# Patient Record
Sex: Male | Born: 1995 | Race: Black or African American | Hispanic: No | Marital: Single | Smoking: Never smoker
Health system: Southern US, Community
[De-identification: ages and names within clinical notes are randomized; demographics above are authoritative.]

## PROBLEM LIST (undated history)

## (undated) DIAGNOSIS — S43492A Other sprain of left shoulder joint, initial encounter: Secondary | ICD-10-CM

## (undated) DIAGNOSIS — F909 Attention-deficit hyperactivity disorder, unspecified type: Secondary | ICD-10-CM

## (undated) DIAGNOSIS — S43432A Superior glenoid labrum lesion of left shoulder, initial encounter: Secondary | ICD-10-CM

## (undated) DIAGNOSIS — M25319 Other instability, unspecified shoulder: Secondary | ICD-10-CM

## (undated) DIAGNOSIS — J45909 Unspecified asthma, uncomplicated: Secondary | ICD-10-CM

## (undated) DIAGNOSIS — T7840XA Allergy, unspecified, initial encounter: Secondary | ICD-10-CM

## (undated) HISTORY — DX: Other instability, unspecified shoulder: M25.319

## (undated) HISTORY — DX: Other sprain of left shoulder joint, initial encounter: S43.492A

## (undated) HISTORY — DX: Superior glenoid labrum lesion of left shoulder, initial encounter: S43.432A

---
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
## 2017-11-30 ENCOUNTER — Emergency Department (HOSPITAL_COMMUNITY): Admission: EM | Admit: 2017-11-30 | Discharge: 2017-11-30 | Discharge status: Against Medical Advice | Payer: Self-pay

## 2019-03-31 ENCOUNTER — Emergency Department (HOSPITAL_COMMUNITY)
Admission: EM | Admit: 2019-03-31 | Discharge: 2019-03-31 | Disposition: A | Discharge status: Home / Self Care | Payer: HRSA Program | Attending: Emergency Medicine | Admitting: Emergency Medicine

## 2019-03-31 ENCOUNTER — Other Ambulatory Visit: Payer: Self-pay

## 2019-03-31 ENCOUNTER — Emergency Department (HOSPITAL_COMMUNITY): Payer: HRSA Program

## 2019-03-31 DIAGNOSIS — Z20822 Contact with and (suspected) exposure to covid-19: Secondary | ICD-10-CM

## 2019-03-31 DIAGNOSIS — Z79899 Other long term (current) drug therapy: Secondary | ICD-10-CM | POA: Diagnosis not present

## 2019-03-31 DIAGNOSIS — U071 COVID-19: Secondary | ICD-10-CM | POA: Diagnosis not present

## 2019-03-31 DIAGNOSIS — J45909 Unspecified asthma, uncomplicated: Secondary | ICD-10-CM | POA: Insufficient documentation

## 2019-03-31 DIAGNOSIS — R0602 Shortness of breath: Secondary | ICD-10-CM | POA: Diagnosis present

## 2019-03-31 NOTE — ED Triage Notes (Signed)
Pt states he has lost sense of taste and smell, feeling intermittent SOB, and fever x 3 days. Pt states he does not feel SOB at this moment.

## 2019-03-31 NOTE — ED Provider Notes (Signed)
[facility] DEPT Provider Note   CSN: [id number] Arrival date & time: 03/31/19  1121    History   Chief Complaint Chief Complaint  Patient presents with  . Shortness of Breath  . Fever    HPI Craig Hubbard is a 23 y.o. male.     Patient with 2-day history of fever and shortness of breath and loss of taste and smell.  Patient is concerned about having COVID-19 infection.  Patient's past medical history noncontributory.  Patient denies any nausea vomiting or diarrhea.  No history of asthma.     Past Medical History:  Diagnosis Date  . ADHD (attention deficit hyperactivity disorder)   . Allergy   . Asthma   . Bankart lesion of left shoulder   . Shoulder joint instability     Patient Active Problem List   Diagnosis Date Noted  . Suicidal ideations   . MDD (major depressive disorder), recurrent episode, severe ([place]) 12/21/2016  . Shoulder joint instability   . Bankart lesion of left shoulder   . ADHD (attention deficit hyperactivity disorder)   . Allergy   . Asthma     Past Surgical History:  Procedure Laterality Date  . SHOULDER ARTHROSCOPY WITH BANKART REPAIR Left 09/03/2013   Procedure: LEFT SHOULDER ARTHROSCOPY WITH CAPSULORRHAPHY;  Surgeon: Lorn Junes, MD;  Location: [facility];  Service: Orthopedics;  Laterality: Left;        Home Medications    Prior to Admission medications   Medication Sig Start Date End Date Taking? Authorizing Provider  FLUoxetine (PROZAC) 20 MG capsule Take 1 capsule (20 mg total) by mouth daily. 12/25/16  Yes Derrill Center, NP  hydrOXYzine (ATARAX/VISTARIL) 25 MG tablet Take 1 tablet (25 mg total) by mouth every 6 (six) hours as needed for anxiety. 12/24/16  Yes Derrill Center, NP  traZODone (DESYREL) 50 MG tablet Take 1 tablet (50 mg total) by mouth at bedtime as needed for sleep. Patient not taking: Reported on 03/31/2019 12/24/16 01/23/17  Derrill Center, NP    Family History  Family History  Problem Relation Age of Onset  . Hypertension Mother   . Diabetes Mother     Social History Social History   Tobacco Use  . Smoking status: Never Smoker  . Smokeless tobacco: Never Used  Substance Use Topics  . Alcohol use: No  . Drug use: No     Allergies   Patient has no known allergies.   Review of Systems Review of Systems  Constitutional: Positive for fever. Negative for chills.  HENT: Negative for rhinorrhea and sore throat.   Eyes: Negative for visual disturbance.  Respiratory: Positive for shortness of breath. Negative for cough.   Cardiovascular: Negative for chest pain and leg swelling.  Gastrointestinal: Negative for abdominal pain, diarrhea, nausea and vomiting.  Genitourinary: Negative for dysuria.  Musculoskeletal: Negative for back pain and neck pain.  Skin: Negative for rash.  Neurological: Negative for dizziness, light-headedness and headaches.  Hematological: Does not bruise/bleed easily.  Psychiatric/Behavioral: Negative for confusion.     Physical Exam Updated Vital Signs BP (!) 129/92   Pulse 89   Temp 98.3 F (36.8 C) (Oral)   Resp 18   SpO2 95%   Physical Exam Vitals signs and nursing note reviewed.  Constitutional:      Appearance: Normal appearance. He is well-developed.  HENT:     Head: Normocephalic and atraumatic.  Eyes:     Conjunctiva/sclera: Conjunctivae normal.  Pupils: Pupils are equal, round, and reactive to light.  Neck:     Musculoskeletal: Normal range of motion and neck supple.  Cardiovascular:     Rate and Rhythm: Normal rate and regular rhythm.     Heart sounds: No murmur.  Pulmonary:     Effort: Pulmonary effort is normal. No respiratory distress.     Breath sounds: Normal breath sounds.  Abdominal:     Palpations: Abdomen is soft.     Tenderness: There is no abdominal tenderness.  Musculoskeletal: Normal range of motion.  Skin:    General: Skin is warm and dry.  Neurological:      General: No focal deficit present.     Mental Status: He is alert and oriented to person, place, and time.      ED Treatments / Results  Labs (all labs ordered are listed, but only abnormal results are displayed) Labs Reviewed - No data to display  EKG None  [facility]  Result Date: 03/31/2019 CLINICAL DATA:  Shortness of breath.  Fever.  Anosmia EXAM: PORTABLE CHEST 1 VIEW COMPARISON:  None. FINDINGS: Lungs are clear. Heart is slightly enlarged with pulmonary vascularity normal. No adenopathy. No bone lesions. IMPRESSION: Mild cardiac enlargement.  No edema or consolidation. Electronically Signed   By: Bretta BangWilliam  Woodruff III M.D.   On: 03/31/2019 13:16    Procedures Procedures (including critical care time)  Medications Ordered in ED Medications - No data to display   Initial Impression / Assessment and Plan / ED Course  I have reviewed the triage vital signs and the nursing notes.  Pertinent labs & imaging results that were available during my care of the patient were reviewed by me and considered in my medical decision making (see chart for details).        Patient nontoxic no acute distress.  Oxygen saturations are fine in the upper 90s.  Patient does not have increased respiratory rate.  Chest x-ray formal results are pending but x-ray looked normal on first reviewed by me.  Patient will require work note.  Patient will have outpatient COVID testing if chest x-ray does not warrant admission.  Patient chest x-ray without any acute findings.  Outpatient test for COVID and give patient work note for the next 5 days.  Patient will return for any new or worse symptoms.  Final Clinical Impressions(s) / ED Diagnoses   Final diagnoses:  Suspected Covid-19 Virus Infection    ED Discharge Orders    None       Vanetta MuldersZackowski, Crystin Lechtenberg, MD 03/31/19 1408

## 2019-03-31 NOTE — Discharge Instructions (Signed)
Work note provided.  Chest x-ray without any acute findings.  Return for any new or worse symptoms.  COVID testing done should have results in 2 to 4 days.

## 2019-04-01 LAB — NOVEL CORONAVIRUS, NAA (HOSP ORDER, SEND-OUT TO REF LAB; TAT 18-24 HRS): SARS-CoV-2, NAA: DETECTED — AB

## 2020-01-29 ENCOUNTER — Other Ambulatory Visit: Payer: Self-pay

## 2020-01-29 ENCOUNTER — Emergency Department (HOSPITAL_COMMUNITY)
Admission: EM | Admit: 2020-01-29 | Discharge: 2020-01-30 | Disposition: A | Discharge status: Home / Self Care | Payer: Self-pay | Attending: Emergency Medicine | Admitting: Emergency Medicine

## 2020-01-29 ENCOUNTER — Encounter (HOSPITAL_COMMUNITY): Payer: Self-pay | Admitting: Behavioral Health

## 2020-01-29 DIAGNOSIS — R030 Elevated blood-pressure reading, without diagnosis of hypertension: Secondary | ICD-10-CM | POA: Insufficient documentation

## 2020-01-29 DIAGNOSIS — J45909 Unspecified asthma, uncomplicated: Secondary | ICD-10-CM | POA: Insufficient documentation

## 2020-01-29 DIAGNOSIS — Z7289 Other problems related to lifestyle: Secondary | ICD-10-CM | POA: Insufficient documentation

## 2020-01-29 DIAGNOSIS — R45851 Suicidal ideations: Secondary | ICD-10-CM | POA: Insufficient documentation

## 2020-01-29 DIAGNOSIS — Z79899 Other long term (current) drug therapy: Secondary | ICD-10-CM | POA: Insufficient documentation

## 2020-01-29 DIAGNOSIS — Z046 Encounter for general psychiatric examination, requested by authority: Secondary | ICD-10-CM | POA: Insufficient documentation

## 2020-01-29 DIAGNOSIS — F4321 Adjustment disorder with depressed mood: Secondary | ICD-10-CM

## 2020-01-29 DIAGNOSIS — F322 Major depressive disorder, single episode, severe without psychotic features: Secondary | ICD-10-CM | POA: Insufficient documentation

## 2020-01-29 LAB — URINALYSIS, ROUTINE W REFLEX MICROSCOPIC
Bilirubin Urine: NEGATIVE
Glucose, UA: NEGATIVE mg/dL
Hgb urine dipstick: NEGATIVE
Ketones, ur: NEGATIVE mg/dL
Leukocytes,Ua: NEGATIVE
Nitrite: NEGATIVE
Protein, ur: NEGATIVE mg/dL
Specific Gravity, Urine: 1.016 (ref 1.005–1.030)
pH: 6 (ref 5.0–8.0)

## 2020-01-29 LAB — RAPID URINE DRUG SCREEN, HOSP PERFORMED
Amphetamines: NOT DETECTED
Barbiturates: NOT DETECTED
Benzodiazepines: NOT DETECTED
Cocaine: NOT DETECTED
Opiates: NOT DETECTED
Tetrahydrocannabinol: POSITIVE — AB

## 2020-01-29 LAB — CBC
HCT: 46.5 % (ref 39.0–52.0)
Hemoglobin: 14.9 g/dL (ref 13.0–17.0)
MCH: 28.1 pg (ref 26.0–34.0)
MCHC: 32 g/dL (ref 30.0–36.0)
MCV: 87.6 fL (ref 80.0–100.0)
Platelets: 234 10*3/uL (ref 150–400)
RBC: 5.31 MIL/uL (ref 4.22–5.81)
RDW: 13.6 % (ref 11.5–15.5)
WBC: 5.7 10*3/uL (ref 4.0–10.5)
nRBC: 0 % (ref 0.0–0.2)

## 2020-01-29 LAB — SALICYLATE LEVEL: Salicylate Lvl: <7 mg/dL — ABNORMAL LOW (ref 7.0–30.0)

## 2020-01-29 LAB — ETHANOL: Alcohol, Ethyl (B): <10 mg/dL (ref <10)

## 2020-01-29 LAB — ACETAMINOPHEN LEVEL: Acetaminophen (Tylenol), Serum: <10 ug/mL — ABNORMAL LOW (ref 10–30)

## 2020-01-29 LAB — CBG MONITORING, ED: Glucose-Capillary: 129 mg/dL — ABNORMAL HIGH (ref 70–99)

## 2020-01-29 NOTE — BH Assessment (Addendum)
Tele Assessment Note   Patient Name: Craig Hubbard MRN: [id number] Referring Physician: EDP Location of Patient: [facility] Location of Provider: Behavioral Health TTS Department  Craig Hubbard is a 24 y.o. male who presented to [facility] on voluntary basis with complaint of suicidal ideation, despondency, and other symptoms.  Pt lives in [place] with parents, and he is a Consulting civil engineer at [employer]  Pt was previously followed by [facility], but he does not go there.  Pt stated that he has a diagnosis of Bipolar I Disorder and has attempted suicide three times in the past (most recently 2018).  Pt reported that for about two months, he has experienced suicidal ideation (currently without plan or intent), and that the suicidal ideation has become more intent since he had a break-up about a week ago.  In addition to suicidal ideation, Pt endorsed despondency, isolation, loss of pleasure in previously pleasurable activities, and fatigue.  Pt denied hallucination, homicidal ideation, and substance use concerns.  Pt endorsed feelings of paranoia when people get close to him.    Pt is without medication.  He expressed interest in inpatient treatment.  During assessment, Pt presented as alert and oriented.  He had good eye contact and was cooperative.  Pt was dressed in scrubs, and he appeared appropriately groomed.  Pt's mood was depressed, and affect was blunted.  Pt's speech was normal in rate, rhythm, and volume.  Thought processes were within normal range, and thought content was logical and goal-oriented.  There was no evidence of delusion.  Pt's memory and concentration were intact.  Insight, judgment, and impulse control were fair.  Consulted with S. Rankin, NP, who determined that Pt meets inpatient criteria.  Diagnosis: Bipolar I, Depressed, Severe   Past Medical History:  Past Medical History:  Diagnosis Date  . ADHD (attention deficit hyperactivity disorder)   . Allergy   . Asthma   . Bankart lesion of  left shoulder   . Shoulder joint instability     Past Surgical History:  Procedure Laterality Date  . SHOULDER ARTHROSCOPY WITH BANKART REPAIR Left 09/03/2013   Procedure: LEFT SHOULDER ARTHROSCOPY WITH CAPSULORRHAPHY;  Surgeon: Nilda Simmer, MD;  Location: [facility];  Service: Orthopedics;  Laterality: Left;    Family History:  Family History  Problem Relation Age of Onset  . Hypertension Mother   . Diabetes Mother     Social History:  reports that he has never smoked. He has never used smokeless tobacco. He reports that he does not drink alcohol or use drugs.  Additional Social History:  Alcohol / Drug Use Pain Medications: See MAR Prescriptions: See MAR Over the Counter: See MAR History of alcohol / drug use?: No history of alcohol / drug abuse  CIWA: CIWA-Ar BP: (!) 140/93 Pulse Rate: 80 COWS:    Allergies: No Known Allergies  Home Medications: (Not in a hospital admission)   OB/GYN Status:  No LMP for male patient.  General Assessment Data Location of Assessment: [facility] TTS Assessment: In system Is this a Tele or Face-to-Face Assessment?: Tele Assessment Is this an Initial Assessment or a Re-assessment for this encounter?: Initial Assessment Patient Accompanied by:: N/A Language Other than English: No Living Arrangements: Other (Comment) What gender do you identify as?: Male Marital status: Single Pregnancy Status: No Living Arrangements: Parent Can pt return to current living arrangement?: Yes Admission Status: Voluntary Is patient capable of signing voluntary admission?: Yes Referral Source: Self/Family/Friend Insurance type: Self     Crisis  Care Plan Living Arrangements: Parent Name of Psychiatrist: None currently Name of Therapist: None currently  Education Status Is patient currently in school?: Yes Name of school: [employer]  Risk to self with the past 6 months Suicidal Ideation: Yes-Currently Present Has patient been a risk  to self within the past 6 months prior to admission? : No Suicidal Intent: No Has patient had any suicidal intent within the past 6 months prior to admission? : No Is patient at risk for suicide?: Yes Suicidal Plan?: No Has patient had any suicidal plan within the past 6 months prior to admission? : No Access to Means: No What has been your use of drugs/alcohol within the last 12 months?: social use of alcohol Previous Attempts/Gestures: Yes How many times?: 3 Triggers for Past Attempts: Unknown(Could not recall) Intentional Self Injurious Behavior: None Family Suicide History: Unknown Recent stressful life event(s): Loss (Comment)(Break-up; no medication) Persecutory voices/beliefs?: No Depression: Yes Depression Symptoms: Despondent, Isolating, Fatigue, Loss of interest in usual pleasures, Feeling worthless/self pity Substance abuse history and/or treatment for substance abuse?: No Suicide prevention information given to non-admitted patients: Not applicable  Risk to Others within the past 6 months Homicidal Ideation: No Does patient have any lifetime risk of violence toward others beyond the six months prior to admission? : No Thoughts of Harm to Others: No Current Homicidal Intent: No Current Homicidal Plan: No Access to Homicidal Means: No History of harm to others?: No Assessment of Violence: None Noted Does patient have access to weapons?: No Criminal Charges Pending?: No Does patient have a court date: No Is patient on probation?: No  Psychosis Hallucinations: None noted Delusions: Persecutory(Endorsed some episodes of paranoia)  Mental Status Report Appearance/Hygiene: Unremarkable, In scrubs Eye Contact: Fair Motor Activity: Freedom of movement, Unremarkable Speech: Logical/coherent Level of Consciousness: Alert Mood: Depressed Affect: Sad Anxiety Level: None Thought Processes: Coherent, Relevant Judgement: Partial Orientation: Person, Place, Time,  Situation Obsessive Compulsive Thoughts/Behaviors: None  Cognitive Functioning Concentration: Normal Memory: Recent Intact, Remote Intact Is patient IDD: No Insight: Fair Impulse Control: Fair Appetite: Good Have you had any weight changes? : No Change Sleep: No Change Total Hours of Sleep: 7  ADLScreening [facility] Assessment Services) Patient's cognitive ability adequate to safely complete daily activities?: Yes Patient able to express need for assistance with ADLs?: Yes Independently performs ADLs?: Yes (appropriate for developmental age)  Prior Inpatient Therapy Prior Inpatient Therapy: Yes Prior Therapy Dates: 2018 Prior Therapy Facilty/Provider(s): [facility] Reason for Treatment: Bipolar I  Prior Outpatient Therapy Prior Outpatient Therapy: Yes Prior Therapy Dates: 2020 and other Prior Therapy Facilty/Provider(s): [facility] Reason for Treatment: Bipolar Does patient have an ACCT team?: No Does patient have Intensive In-House Services?  : No Does patient have [facility] services? : No Does patient have P4[facility] services?: No  ADL Screening (condition at time of admission) Patient's cognitive ability adequate to safely complete daily activities?: Yes Is the patient deaf or have difficulty hearing?: No Does the patient have difficulty seeing, even when wearing glasses/contacts?: No Does the patient have difficulty concentrating, remembering, or making decisions?: No Patient able to express need for assistance with ADLs?: Yes Does the patient have difficulty dressing or bathing?: No Independently performs ADLs?: Yes (appropriate for developmental age) Does the patient have difficulty walking or climbing stairs?: No Weakness of Legs: None Weakness of Arms/Hands: None  Home Assistive Devices/Equipment Home Assistive Devices/Equipment: None  Therapy Consults (therapy consults require a physician order) PT Evaluation Needed: No OT Evalulation Needed: No SLP Evaluation Needed:  No  Abuse/Neglect Assessment (Assessment to be complete while patient is alone) Abuse/Neglect Assessment Can Be Completed: Yes Physical Abuse: Denies Verbal Abuse: Denies Sexual Abuse: Denies Exploitation of patient/patient's resources: Denies Self-Neglect: Denies Values / Beliefs Cultural Requests During Hospitalization: None Spiritual Requests During Hospitalization: None Consults Spiritual Care Consult Needed: No Transition of Care Team Consult Needed: No Advance Directives (For Healthcare) Does Patient Have a Medical Advance Directive?: No Would patient like information on creating a medical advance directive?: No - Patient declined          Disposition:  Disposition Initial Assessment Completed for this Encounter: Yes Disposition of Patient: Admit Type of inpatient treatment program: Adult  This service was provided via telemedicine using a 2-way, interactive audio and video technology.  Names of all persons participating in this telemedicine service and their role in this encounter. Name: Craig Hubbard Role: Patient             Marlowe Aschoff 01/29/2020 5:09 PM

## 2020-01-29 NOTE — ED Triage Notes (Signed)
Pt to [facility] ED voluntary. Pt brought to ED by father. Pt states he is having SI thoughts, no plan. Pt cooperative. Pt dressed out in purple scrubs. Belongings put in locker.

## 2020-01-29 NOTE — ED Provider Notes (Signed)
[facility] DEPT Provider Note   CSN: [id number] Arrival date & time: 01/29/20  1547     History Chief Complaint  Patient presents with  . Psychiatric Evaluation    SI thoughts    Craig Hubbard is a 24 y.o. male.  HPI 24 year-old male with a history of ADD, ADHD, asthma, allergies, self-reported history of bipolar disorder though not on his problem list presents to the ER for suicidal ideations.  Patient states that he "is in a low" and has been for quite some time since January.  He says during these lows he frequently has suicidal thoughts, reports that previous attempts have included cutting himself and taking pills.  He does not have an exact plan at this time.  He states that he has been compliant with both his medications but they have not been working.  He reports he was last seen here in the ED in 2018 and that helped him quite significantly.  He is wanting help with his medication regimen. He denies HI, visual or auditory hallucinations. No recent drug or alcohol use.  He is pleasant and cooperative.    Past Medical History:  Diagnosis Date  . ADHD (attention deficit hyperactivity disorder)   . Allergy   . Asthma   . Bankart lesion of left shoulder   . Shoulder joint instability     Patient Active Problem List   Diagnosis Date Noted  . Suicidal ideations   . MDD (major depressive disorder), recurrent episode, severe ([facility]) 12/21/2016  . Shoulder joint instability   . Bankart lesion of left shoulder   . ADHD (attention deficit hyperactivity disorder)   . Allergy   . Asthma     Past Surgical History:  Procedure Laterality Date  . SHOULDER ARTHROSCOPY WITH BANKART REPAIR Left 09/03/2013   Procedure: LEFT SHOULDER ARTHROSCOPY WITH CAPSULORRHAPHY;  Surgeon: Lorn Junes, MD;  Location: [facility];  Service: Orthopedics;  Laterality: Left;       Family History  Problem Relation Age of Onset  . Hypertension Mother     . Diabetes Mother     Social History   Tobacco Use  . Smoking status: Never Smoker  . Smokeless tobacco: Never Used  Substance Use Topics  . Alcohol use: No  . Drug use: No    Home Medications Prior to Admission medications   Medication Sig Start Date End Date Taking? Authorizing Provider  FLUoxetine (PROZAC) 20 MG capsule Take 1 capsule (20 mg total) by mouth daily. 12/25/16   Derrill Center, NP  hydrOXYzine (ATARAX/VISTARIL) 25 MG tablet Take 1 tablet (25 mg total) by mouth every 6 (six) hours as needed for anxiety. 12/24/16   Derrill Center, NP  traZODone (DESYREL) 50 MG tablet Take 1 tablet (50 mg total) by mouth at bedtime as needed for sleep. Patient not taking: Reported on 03/31/2019 12/24/16 01/23/17  Derrill Center, NP    Allergies    Patient has no known allergies.  Review of Systems   Review of Systems  Constitutional: Negative for chills and fever.  HENT: Negative for ear pain and sore throat.   Eyes: Negative for pain and visual disturbance.  Respiratory: Negative for cough and shortness of breath.   Cardiovascular: Negative for chest pain and palpitations.  Gastrointestinal: Negative for abdominal pain and vomiting.  Genitourinary: Negative for dysuria and hematuria.  Musculoskeletal: Negative for arthralgias and back pain.  Skin: Negative for color change and rash.  Neurological:  Negative for seizures and syncope.  Psychiatric/Behavioral: Positive for behavioral problems, self-injury and suicidal ideas. Negative for agitation, confusion, decreased concentration, dysphoric mood and hallucinations.  All other systems reviewed and are negative.   Physical Exam Updated Vital Signs BP (!) 140/93 (BP Location: Left Arm)   Pulse 80   Temp 98.5 F (36.9 C) (Oral)   Resp 17   SpO2 98%   Physical Exam Vitals and nursing note reviewed.  Constitutional:      Appearance: He is well-developed.  HENT:     Head: Normocephalic and atraumatic.  Eyes:      Conjunctiva/sclera: Conjunctivae normal.  Cardiovascular:     Rate and Rhythm: Normal rate and regular rhythm.     Heart sounds: No murmur.  Pulmonary:     Effort: Pulmonary effort is normal. No respiratory distress.     Breath sounds: Normal breath sounds.  Abdominal:     Palpations: Abdomen is soft.     Tenderness: There is no abdominal tenderness.  Musculoskeletal:     Cervical back: Neck supple.  Skin:    General: Skin is warm and dry.  Neurological:     Mental Status: He is alert.  Psychiatric:        Attention and Perception: Attention and perception normal. He does not perceive auditory or visual hallucinations.        Mood and Affect: Mood and affect normal.        Speech: Speech normal.        Behavior: Behavior normal. Behavior is cooperative.        Thought Content: Thought content normal.        Cognition and Memory: Cognition and memory normal.        Judgment: Judgment normal.     ED Results / Procedures / Treatments   Labs (all labs ordered are listed, but only abnormal results are displayed) Labs Reviewed  RAPID URINE DRUG SCREEN, HOSP PERFORMED - Abnormal; Notable for the following components:      Result Value   Tetrahydrocannabinol POSITIVE (*)    All other components within normal limits  CBG MONITORING, ED - Abnormal; Notable for the following components:   Glucose-Capillary 129 (*)    All other components within normal limits  URINALYSIS, ROUTINE W REFLEX MICROSCOPIC  ETHANOL  SALICYLATE LEVEL  ACETAMINOPHEN LEVEL  CBC    EKG None  Radiology No results found.  Procedures Procedures (including critical care time)  Medications Ordered in ED Medications - No data to display  ED Course  I have reviewed the triage vital signs and the nursing notes.  Pertinent labs & imaging results that were available during my care of the patient were reviewed by me and considered in my medical decision making (see chart for details).    MDM  Rules/Calculators/A&P                       Patient was seen and evaluated in the ER for suicidal ideations.  Patient denies any visual auditory hallucinations, homicidal ideations, recent alcohol or drug use.  I personally reviewed his lab work and vital signs which were without any acute abnormalities.  He was mildy hypertensive on presentation to the ER. He did test positive for THC. He is medically cleared for TTS evaluation.   The patient has been placed in psychiatric observation due to the need to provide a safe environment for the patient while obtaining psychiatric consultation and evaluation, as well as ongoing  medical and medication management to treat the patient's condition.  The patient has not been placed under full IVC at this time.   Final Clinical Impression(s) / ED Diagnoses Final diagnoses:  Suicidal ideations    Rx / DC Orders ED Discharge Orders    None       Leone Brand 01/29/20 2033    Benjiman Core, MD 01/30/20 [phone]

## 2020-01-29 NOTE — ED Triage Notes (Signed)
Pt presents with 3 pt belonging bags (1 black bookbag and 2 pt bags) located in locker 30

## 2020-01-29 NOTE — ED Notes (Signed)
On initial assessment he reported he is here because for a month he has been in a down turn. States he has been depressed and having thoughts of SI without a plan or current intent, but has gotten tired of feeling this way and came into the hospital to get some help today. States he has felt this way in the past, unclear what has helped it. Reports he does have support systems. When asked what his stressors are, seemed confused by this word, clarified the word and he responded he didn't have any. Pleasant, superficial.

## 2020-01-30 ENCOUNTER — Telehealth (HOSPITAL_COMMUNITY): Payer: Self-pay | Admitting: Professional

## 2020-01-30 DIAGNOSIS — F4321 Adjustment disorder with depressed mood: Secondary | ICD-10-CM

## 2020-01-30 NOTE — Discharge Instructions (Signed)
For your mental health needs, you are advised to follow up with [facility].  Call them at your earliest opportunity to schedule an intake appointment:       [facility]      [address]      [place], [place] [address]      [phone]      Crisis number: [phone]  

## 2020-01-30 NOTE — BH Assessment (Signed)
[facility] Assessment Progress Note  Per Ophelia Shoulder, NP, this pt does not require psychiatric hospitalization at this time.  Pt is to be discharged from [facility] with recommendation to follow up with [facility].  This has been included in pt's discharge instructions.  Pt's nurse, Kendal Hymen, has been notified.  Doylene Canning, MA Triage Specialist [phone]

## 2020-01-30 NOTE — Progress Notes (Addendum)
  TOC CM spoke to pt and states he is established with [facility] but had to update his information. He plans to go by clinic today to re-establish with clinic. Provided pt with TOC CM contact information to call if he wants to go to a [facility] for follow up until he is seen at [facility]. ED provider updated. Isidoro Donning RN CCM, [facility] ED TOC CM [phone]

## 2020-01-30 NOTE — ED Notes (Signed)
Pt discharged safely after contracting for safety.  Pt was instructed to follow up with [facility].  Pt was given all of his belongings and discharged safely with family.

## 2020-01-30 NOTE — BHH Suicide Risk Assessment (Cosign Needed)
Suicide Risk Assessment  Discharge Assessment   [facility] Discharge Suicide Risk Assessment   Principal Problem: Adjustment disorder with depressed mood Discharge Diagnoses: Principal Problem:   Adjustment disorder with depressed mood Active Problems:   Suicidal ideations   Craig Hubbard, 24 y.o., male patient seen via telepsych by this provider and Dr. Dwyane Dee and chart reviewed on 01/30/20.  On evaluation Craig [facility]  Reports he is here for suicidal ideations without plan or intent, present over the past month.  He verbalizes psychosocial stressors, climatic in the setting of recent breakup with his girlfriend.  He reports a history for depression for which he was previously managed with prozac by Dr. Sabra Heck with [facility].   He stopped taking the medication because he did not think it worked for him.   His last visit was July 2020.  The patient is known to this facility for similar presentation; he was inpatient for 4 days during 2018 at [facility] for suicidal ideations with similars concerns of mounting psychosocial stressors and discharged on fluoxetine, hdydrozyzine and trazodone to follow-up with [facility] for ongoing outpatient care.   On assessment today, he describes his mood as sad but improved compared to when he first presented on 5/12. Affect is congruent. No thought disorders noted. He denies suicidal ideations. He denies homicidal ideations. No hallucinations, no delusions are expressed. Does not appear internally preoccupied. Patient contracts for safety. 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    Total Time spent with patient: 30 minutes  Musculoskeletal: Strength & Muscle Tone: within normal limits Gait & Station: normal Patient leans: N/A  Psychiatric Specialty Exam:   Blood pressure 137/82, pulse 89, temperature 97.9 F (36.6 C), temperature source Oral, resp. rate 18, SpO2 98 %.There is no height or weight on  file to calculate BMI.  General Appearance: Casual  Eye Contact::  Good  Speech:  Clear and Coherent and Normal Rate  Volume:  Normal  Mood:  Depressed  Affect:  Appropriate, Congruent and in the setting of reported relationship breakup  Thought Process:  Coherent and Descriptions of Associations: Intact  Orientation:  Full (Time, Place, and Person)  Thought Content:  Logical  Suicidal Thoughts:  No  Homicidal Thoughts:  No  Memory:  Immediate;   Good Recent;   Good Remote;   Good  Judgement:  Fair  Insight:  Good  Psychomotor Activity:  Normal  Concentration:  Good  Recall:  Good  Fund of Knowledge:Good  Language: Good  Akathisia:  Negative  Handed:  Right  AIMS (if indicated):     Assets:  Communication Skills Desire for Improvement Housing  Sleep:     Cognition: WNL  ADL's:  Intact   Mental Status Per Nursing Assessment::   On Admission:     Demographic Factors:  Male and Age 96 or older  Loss Factors: Loss of significant relationship  Historical Factors: Impulsivity  Risk Reduction Factors:   Sense of responsibility to family, Living with another person, especially a relative and Positive social support  Continued Clinical Symptoms:  Previous Psychiatric Diagnoses and Treatments  Cognitive Features That Contribute To Risk:  None    Suicide Risk:  Minimal: No identifiable suicidal ideation.  Patients presenting with no risk factors but with morbid ruminations; may be classified as minimal risk based on the severity of the depressive symptoms   Plan Of Care/Follow-up recommendations:  Plan- As per above assessment , there are no current  grounds for involuntary commitment at this time. Patient is not currently interested in inpatient services, but expresses agreement to continue outpatient treatment.  SW to provide outpatient resources for follow-up.   Attempted to give report to EDP but unavailable.  Chales Abrahams, NP 01/30/2020, 10:34 AM

## 2020-01-30 NOTE — Telephone Encounter (Signed)
Cln called pt again. Pt reports he went to ED yesterday due to SI/SH. Pt shares he wants med man. Pt has "state funded" ins and was referred back to [facility]. Cln encourages pt to reach out to [facility]. Cln tells pt PHP for state funded pts should be opening in the next few months and pt can reach back out to this cln if feels services are still needed at that time- this cln will provide contact information at that time. Contact info is not available yet.

## 2020-06-24 ENCOUNTER — Other Ambulatory Visit: Payer: Self-pay

## 2020-06-24 ENCOUNTER — Ambulatory Visit (INDEPENDENT_AMBULATORY_CARE_PROVIDER_SITE_OTHER): Payer: No Payment, Other | Admitting: Clinical

## 2020-06-24 DIAGNOSIS — F3341 Major depressive disorder, recurrent, in partial remission: Secondary | ICD-10-CM | POA: Diagnosis not present

## 2020-06-24 DIAGNOSIS — F603 Borderline personality disorder: Secondary | ICD-10-CM
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
## 2020-07-20 ENCOUNTER — Telehealth (HOSPITAL_COMMUNITY): Payer: Self-pay | Admitting: Clinical

## 2020-07-20 ENCOUNTER — Other Ambulatory Visit: Payer: Self-pay

## 2020-07-20 ENCOUNTER — Ambulatory Visit (HOSPITAL_COMMUNITY): Payer: No Payment, Other | Admitting: Clinical

## 2020-07-20 NOTE — Telephone Encounter (Signed)
Therapist sent the client a text message link via mychart for the scheduled virtual therapy visit. Client did not respond to the link. Therapist followed up with an attempted tele -phone call to the clients cell phone number. Client picked up the phone but hung up.

## 2020-08-11 ENCOUNTER — Telehealth (HOSPITAL_COMMUNITY): Payer: No Payment, Other

## 2020-08-26 ENCOUNTER — Ambulatory Visit: Payer: Self-pay | Admitting: Obstetrics and Gynecology

## 2020-11-26 IMAGING — DX PORTABLE CHEST - 1 VIEW
1 series · 1 of 1 positions shown · non-contrast
Comparison: None.

CLINICAL DATA: Shortness of breath.  Fever.  Anosmia

EXAM:
PORTABLE CHEST 1 VIEW

[chest ap]
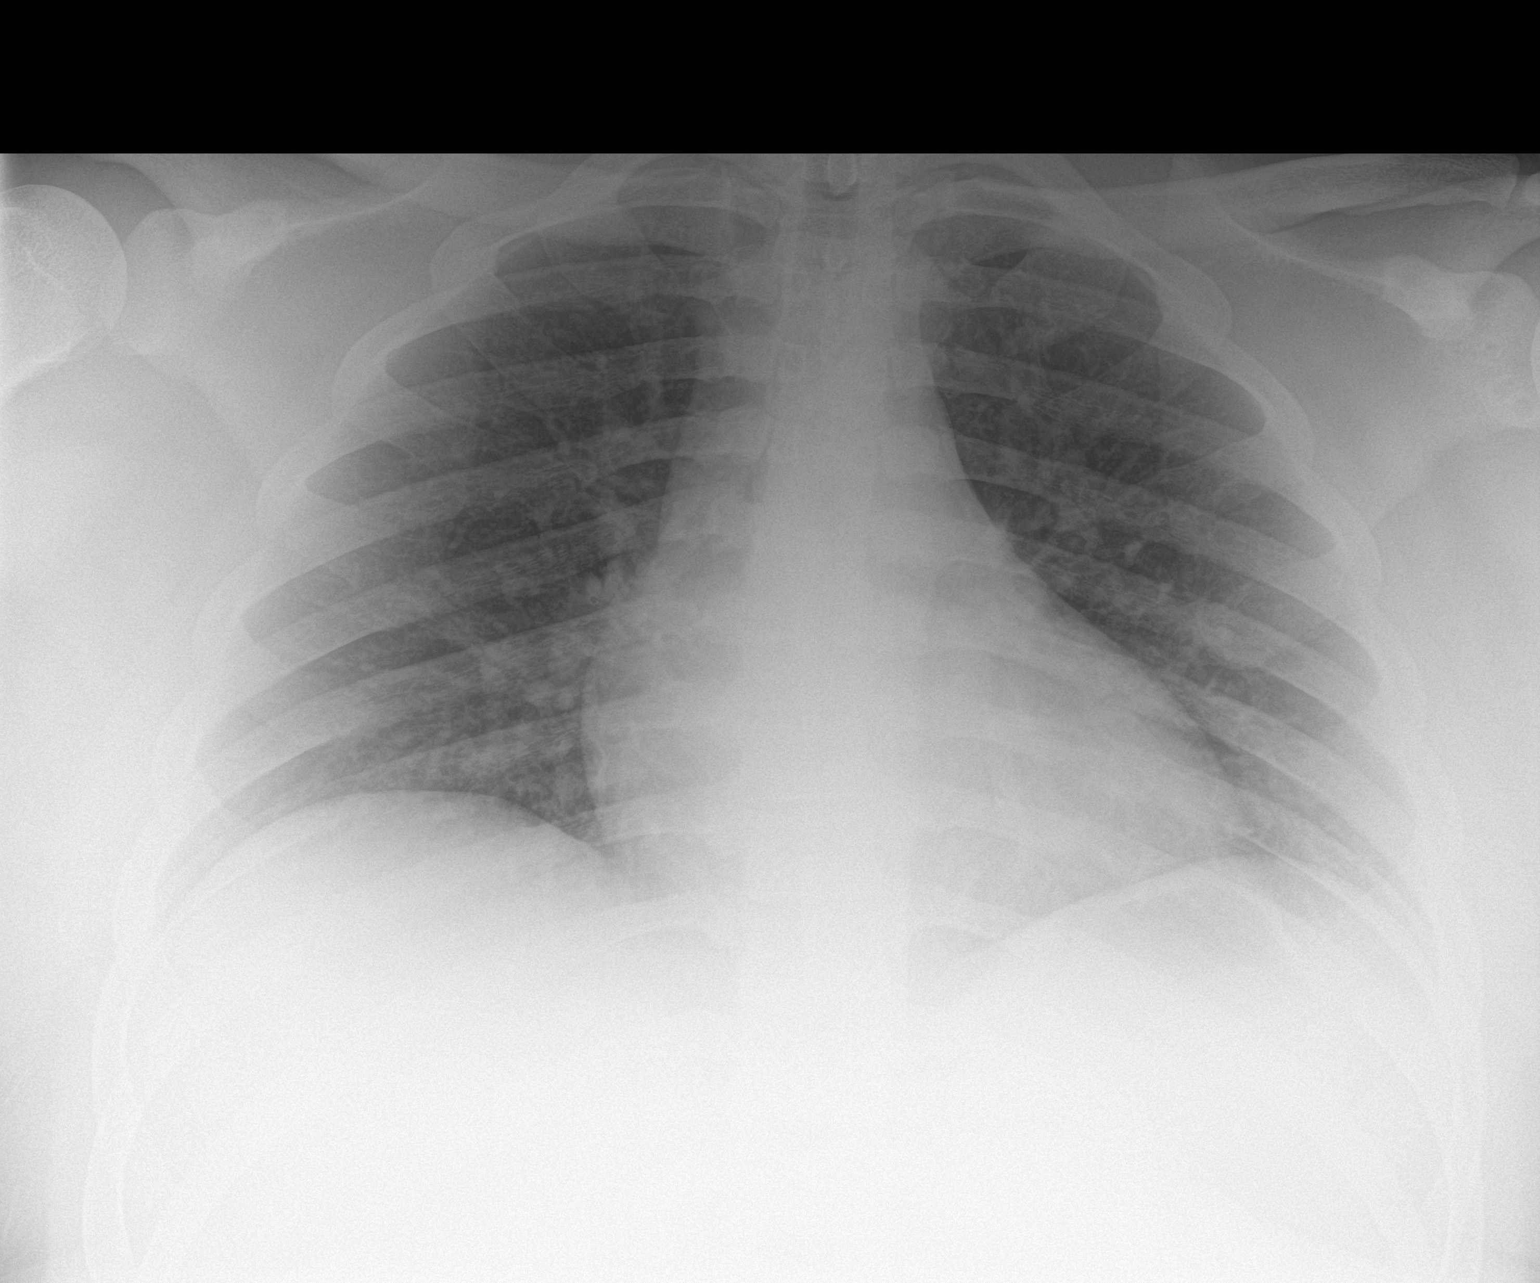

[1 of 1 positions shown; findings below may reference images not displayed]

FINDINGS: Lungs are clear. Heart is slightly enlarged with pulmonary
vascularity normal. No adenopathy. No bone lesions.
IMPRESSION: Mild cardiac enlargement.  No edema or consolidation.

## 2024-04-28 ENCOUNTER — Other Ambulatory Visit: Payer: Self-pay

## 2024-04-28 ENCOUNTER — Encounter (HOSPITAL_BASED_OUTPATIENT_CLINIC_OR_DEPARTMENT_OTHER): Payer: Self-pay | Admitting: Emergency Medicine

## 2024-04-28 ENCOUNTER — Emergency Department (HOSPITAL_BASED_OUTPATIENT_CLINIC_OR_DEPARTMENT_OTHER)
Admission: EM | Admit: 2024-04-28 | Discharge: 2024-04-28 | Disposition: A | Discharge status: Home / Self Care | Payer: Self-pay | Attending: Emergency Medicine | Admitting: Emergency Medicine

## 2024-04-28 DIAGNOSIS — R197 Diarrhea, unspecified: Secondary | ICD-10-CM | POA: Insufficient documentation

## 2024-04-28 LAB — URINALYSIS, ROUTINE W REFLEX MICROSCOPIC
Bilirubin Urine: NEGATIVE
Glucose, UA: NEGATIVE mg/dL
Ketones, ur: NEGATIVE mg/dL
Leukocytes,Ua: NEGATIVE
Nitrite: NEGATIVE
Protein, ur: NEGATIVE mg/dL
Specific Gravity, Urine: >=1.03 (ref 1.005–1.030)
pH: 5.5 (ref 5.0–8.0)

## 2024-04-28 LAB — CBC
HCT: 44 % (ref 39.0–52.0)
Hemoglobin: 14.1 g/dL (ref 13.0–17.0)
MCH: 27.4 pg (ref 26.0–34.0)
MCHC: 32 g/dL (ref 30.0–36.0)
MCV: 85.6 fL (ref 80.0–100.0)
Platelets: 217 K/uL (ref 150–400)
RBC: 5.14 MIL/uL (ref 4.22–5.81)
RDW: 13.8 % (ref 11.5–15.5)
WBC: 4.1 K/uL (ref 4.0–10.5)
nRBC: 0 % (ref 0.0–0.2)

## 2024-04-28 LAB — COMPREHENSIVE METABOLIC PANEL WITH GFR
ALT: 20 U/L (ref 0–44)
AST: 22 U/L (ref 15–41)
Albumin: 4.2 g/dL (ref 3.5–5.0)
Alkaline Phosphatase: 85 U/L (ref 38–126)
Anion gap: 9 (ref 5–15)
BUN: 9 mg/dL (ref 6–20)
CO2: 26 mmol/L (ref 22–32)
Calcium: 9.6 mg/dL (ref 8.9–10.3)
Chloride: 104 mmol/L (ref 98–111)
Creatinine, Ser: 0.77 mg/dL (ref 0.61–1.24)
GFR, Estimated: >60 mL/min (ref >60)
Glucose, Bld: 81 mg/dL (ref 70–99)
Potassium: 4.1 mmol/L (ref 3.5–5.1)
Sodium: 139 mmol/L (ref 135–145)
Total Bilirubin: 0.4 mg/dL (ref 0.0–1.2)
Total Protein: 7.8 g/dL (ref 6.5–8.1)

## 2024-04-28 LAB — URINALYSIS, MICROSCOPIC (REFLEX)

## 2024-04-28 LAB — LIPASE, BLOOD: Lipase: 23 U/L (ref 11–51)

## 2024-04-28 NOTE — ED Triage Notes (Addendum)
 Pt reports nausea, diarrhea, and mid ABD pain x 3d, denies emesis or fever, unsure of contact with anyone sick but does work at the [employer]

## 2024-04-28 NOTE — ED Provider Notes (Signed)
 [place] [facility] AT [facility] Provider Note   CSN: [id number] Arrival date & time: 04/28/24  1512     Patient presents with: Diarrhea   Craig Hubbard is a 28 y.o. male.   28 year old otherwise healthy male presents with complaint of diarrhea starting 72 hours ago after eating salad at home.  He denies any significant abdominal pain, fevers, vomiting.  He reports having 4 episodes of loose stools today.  Patient is taking Pepto-Bismol for his symptoms.  No recent travel, sick contacts or any biotics.  No other complaints or concerns.       Prior to Admission medications   Medication Sig Start Date End Date Taking? Authorizing Provider  FLUoxetine  (PROZAC ) 20 MG capsule Take 1 capsule (20 mg total) by mouth daily. 12/25/16   Ezzard Staci SAILOR, NP  hydrOXYzine  (ATARAX /VISTARIL ) 25 MG tablet Take 1 tablet (25 mg total) by mouth every 6 (six) hours as needed for anxiety. 12/24/16   Ezzard Staci SAILOR, NP  traZODone  (DESYREL ) 50 MG tablet Take 1 tablet (50 mg total) by mouth at bedtime as needed for sleep. Patient not taking: Reported on 03/31/2019 12/24/16 01/23/17  Ezzard Staci SAILOR, NP    Allergies: Patient has no known allergies.    Review of Systems Negative except as per HPI Updated Vital Signs BP 130/85 (BP Location: Left Arm)   Pulse 70   Temp 98.6 F (37 C)   Resp 18   Ht 5' 6 (1.676 m)   Wt (!) 155.1 kg   SpO2 98%   BMI 55.20 kg/m   Physical Exam Vitals and nursing note reviewed.  Constitutional:      General: He is not in acute distress.    Appearance: He is well-developed. He is not diaphoretic.  HENT:     Head: Normocephalic and atraumatic.     Mouth/Throat:     Mouth: Mucous membranes are moist.  Pulmonary:     Effort: Pulmonary effort is normal.  Abdominal:     Palpations: Abdomen is soft.     Tenderness: There is no abdominal tenderness. There is no right CVA tenderness or left CVA tenderness.  Skin:    General: Skin is warm and dry.      Findings: No erythema or rash.  Neurological:     Mental Status: He is alert and oriented to person, place, and time.  Psychiatric:        Behavior: Behavior normal.     (all labs ordered are listed, but only abnormal results are displayed) Labs Reviewed  LIPASE, BLOOD  COMPREHENSIVE METABOLIC PANEL WITH GFR  CBC  URINALYSIS, ROUTINE W REFLEX MICROSCOPIC    EKG: None  Radiology: No results found.   Procedures   Medications Ordered in the ED - No data to display                                  Medical Decision Making Amount and/or Complexity of Data Reviewed Labs: ordered.   This patient presents to the ED for concern of diarrhea, this involves an extensive number of treatment options, and is a complaint that carries with it a high risk of complications and morbidity.  The differential diagnosis includes infectious diarrhea, colitis, electrolyte/metabolic    Co morbidities / Chronic conditions that complicate the patient evaluation  Otherwise healthy    Additional history obtained:  Additional history obtained from EMR External records from  outside source obtained and reviewed including prior labs on file from 7 years ago.   Lab Tests:  I Ordered, and personally interpreted labs.  The pertinent results include: CBC, CMP, lipase within normals.   Problem List / ED Course / Critical interventions / Medication management  28 year old male presents with concern for 72 hours of diarrhea.patient is well-appearing, nontoxic and in no distress.  His labs are reassuring.  Abdomen is soft and nontender.  Do not suspect imaging is necessary at this time.  Discussed use of Imodium for diarrhea as needed as directed.  Given instructions for diarrhea diet and return precautions.   I have reviewed the patients home medicines and have made adjustments as needed   Social Determinants of Health:  Has PCP   Test / Admission - Considered:  Stable for dc       Final diagnoses:  Diarrhea, unspecified type    ED Discharge Orders     None          Beverley Leita LABOR, PA-C 04/28/24 1631    Mannie Pac T, DO 05/01/24 [phone]

## 2024-04-28 NOTE — Discharge Instructions (Signed)
 See dietary instructions for diarrhea. You can try taking Imodium if needed. Follow-up with your primary care provider if diarrhea persists.  Return to the ER for severe or concerning symptoms.
